# Patient Record
Sex: Female | Born: 2000 | Race: Black or African American | Hispanic: No | Marital: Single | Smoking: Never smoker
Health system: Southern US, Community
[De-identification: ages and names within clinical notes are randomized; demographics above are authoritative.]

---
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
## 2021-02-27 ENCOUNTER — Telehealth: Payer: Self-pay

## 2021-02-27 NOTE — Telephone Encounter (Signed)
Tish from [place] [facility] called would like to see about setting up time to talk to Dr. Milinda Antis about patient. States that she has ROI from patient. Please call to set something up.

## 2021-02-27 NOTE — Telephone Encounter (Signed)
Dr. Milinda Antis please advise, let me know what time and date works for you so I can f/u with [facility]

## 2021-02-27 NOTE — Telephone Encounter (Signed)
How about Thursday when I am done seeing patients around 12:30-1?

## 2021-02-27 NOTE — Telephone Encounter (Signed)
Left VM letting Tish know what time/day works for Dr. Milinda Antis and she can call back and confirm or just have provider call back at that time

## 2021-03-09 ENCOUNTER — Telehealth: Payer: Self-pay | Admitting: Family Medicine

## 2021-03-09 NOTE — Telephone Encounter (Signed)
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

## 2021-03-09 NOTE — Telephone Encounter (Signed)
Pt notified of Dr. Royden Purl comments/instructions and agreed to scheduled f/u. Appt scheduled for 03/14/21  FYI to PCP

## 2021-03-14 ENCOUNTER — Other Ambulatory Visit: Payer: Self-pay

## 2021-03-14 ENCOUNTER — Ambulatory Visit: Payer: Managed Care, Other (non HMO) | Admitting: Family Medicine

## 2021-03-14 ENCOUNTER — Encounter: Payer: Self-pay | Admitting: Family Medicine

## 2021-03-14 VITALS — BP 98/58 | HR 70 | Temp 96.9°F | Ht 65.0 in | Wt 133.1 lb

## 2021-03-14 DIAGNOSIS — E611 Iron deficiency: Secondary | ICD-10-CM | POA: Diagnosis not present

## 2021-03-14 DIAGNOSIS — F4323 Adjustment disorder with mixed anxiety and depressed mood: Secondary | ICD-10-CM | POA: Diagnosis not present

## 2021-03-14 DIAGNOSIS — F509 Eating disorder, unspecified: Secondary | ICD-10-CM

## 2021-03-14 NOTE — Patient Instructions (Addendum)
Try and get some protein three times daily   Try to eat a small meal even if not hungry  Non dairy protein shake or protein bar  Look for ensure protein free /vegan shakes   Other brands include Evolve Orgain Aloha   Any protein powder could also be mixed with oat milk   I think you should have one session with nutritionist  Please call the numbers that Krystal Brock gave you  Discuss with her at the next visit   Start a multi vitamin with iron daily  You are a little iron deficient  Look for a gummy or a small pill  Any multi vitamin with iron over the counter   Start that daily and stop up front to plan labs in 4-6 weeks for iron   We can consider medication for anxiety and depression (may help eating problems also)  Let me know if you are interested Talk to your counselor first

## 2021-03-14 NOTE — Assessment & Plan Note (Signed)
Seeing counselor Chaya Jan at [facility]  Enc her to continue  Long discussion re: affect on eating and self care Stress is lower out of school  Urged to continue counseling  Improve diet and self care (per pt that is improved) Offered info/handout on fluoxetine to consider and pt will call if interested

## 2021-03-14 NOTE — Progress Notes (Signed)
Subjective:    Patient ID: Krystal Brock, female    DOB: 2001-09-02, 20 y.o.   MRN: [id number]  This visit occurred during the SARS-CoV-2 public health emergency.  Safety protocols were in place, including screening questions prior to the visit, additional usage of staff PPE, and extensive cleaning of exam room while observing appropriate contact time as indicated for disinfecting solutions.   HPI Wt Readings from Last 3 Encounters:  03/14/21 133 lb 2 oz (60.4 kg) (61 %, Z= 0.28)*  12/16/20 127 lb (57.6 kg) (51 %, Z= 0.03)*  05/20/20 127 lb 7 oz (57.8 kg) (55 %, Z= 0.12)*   * Growth percentiles are based on CDC (Girls, 2-20 Years) data.   22.15 kg/m (57 %, Z= 0.16, Source: CDC (Girls, 2-20 Years))  Pt presents for f/u to discuss nutrition  I spoke to her [facility] at [facility]  I rev last PE and labs with her   She is in counseling for adj disorder with  anx/depressed mood  Not on medication  Has a lot of food fixation issues   Per pt-on and off for entire life  Would not eat  Then more hungry when period comes  Used to really care about her weight  Does not care as much now    Parents would buy her snack  Enc her to eat protein    Eating better right now because she is closer to food  In college she did not want to walk to get food   Likes anything with cinnamon in it  Mood for the most part is improved from college Still has her moments  Worse when she is on her period   Pt thinks that her mood does not affect eating that much (more a preference issue)  If stressed out- won't eat   Today she has not eaten  Not emotional either   She cannot make herself eat when she is not hungry  This happened a lot in HS    She was given numbers of 2 nutritionists to connect to  Not planning to go because she thinks she is doing better    Lab Results  Component Value Date   WBC 4.1 (L) 12/06/2020   HGB 10.8 (L) 12/06/2020   HCT 32.1  (L) 12/06/2020   MCV 91.9 12/06/2020   PLT 229.0 12/06/2020   No results found for: IRON, TIBC, FERRITIN  Some fatigue -? If iron related  No restless legs    Prefers to stay in the 120s   Protein:  Chicken  Occ hamburger or ham  No nuts  No peanut butter  Likes almond and oat milk  Lactose intolerant - no dairy  No beans  No other meat  No fish   Is interested in protein shake if non dairy   Fruit -likes it  Grapes Plums Blueberries Apples  Watermelon   Vegetables  None whatsoever  Taste and texure both - "disgusting"   Junk food  Cinnomin roles and crumb cakes  [place] toast sticks  Fixated with cinnomin (really likes it)  Rarely eats chips   Fast food A lot of pizza (even with lactose intol)  Chick filet  Occ burger   Occ ham   Patient Active Problem List   Diagnosis Date Noted   Iron deficiency 03/14/2021   Disordered eating 12/16/2020   Routine general medical examination at a health care facility 12/05/2020   Pilonidal cyst 05/20/2020   Intolerance, food  08/03/2019   GERD (gastroesophageal reflux disease) 08/03/2019   Other allergic rhinitis 08/03/2019   No past medical history on file. No past surgical history on file. Social History   Tobacco Use   Smoking status: Never   Smokeless tobacco: Never  Vaping Use   Vaping Use: Never used  Substance Use Topics   Alcohol use: Never   Drug use: Never   Family History  Problem Relation Age of Onset   Allergic rhinitis Father    Asthma Father    Thyroid disease Paternal Grandmother    Migraines Neg Hx    Celiac disease Neg Hx    Crohn's disease Neg Hx    Colitis Neg Hx    Diabetes Neg Hx    GER disease Neg Hx    Allergies  Allergen Reactions   Amoxicillin    Clindamycin/Lincomycin Rash    Delayed rash on trunk   No current outpatient medications on file prior to visit.   No current facility-administered medications on file prior to visit.    Review of Systems  Constitutional:   Positive for fatigue. Negative for activity change, appetite change, fever and unexpected weight change.  HENT:  Negative for congestion, ear pain, rhinorrhea, sinus pressure and sore throat.   Eyes:  Negative for pain, redness and visual disturbance.  Respiratory:  Negative for cough, shortness of breath and wheezing.   Cardiovascular:  Negative for chest pain and palpitations.  Gastrointestinal:  Negative for abdominal pain, blood in stool, constipation and diarrhea.  Endocrine: Negative for polydipsia and polyuria.  Genitourinary:  Negative for dysuria, frequency and urgency.  Musculoskeletal:  Negative for arthralgias, back pain and myalgias.  Skin:  Negative for pallor and rash.  Allergic/Immunologic: Negative for environmental allergies.  Neurological:  Negative for dizziness, syncope and headaches.  Hematological:  Negative for adenopathy. Does not bruise/bleed easily.  Psychiatric/Behavioral:  Positive for dysphoric mood. Negative for decreased concentration, self-injury and suicidal ideas. The patient is nervous/anxious.       Objective:   Physical Exam Constitutional:      General: She is not in acute distress.    Appearance: Normal appearance. She is normal weight. She is not ill-appearing.  Eyes:     Conjunctiva/sclera: Conjunctivae normal.     Pupils: Pupils are equal, round, and reactive to light.  Cardiovascular:     Rate and Rhythm: Normal rate and regular rhythm.     Heart sounds: Normal heart sounds.  Pulmonary:     Effort: Pulmonary effort is normal. No respiratory distress.     Breath sounds: No wheezing.  Musculoskeletal:     Cervical back: Normal range of motion and neck supple. No tenderness.     Right lower leg: No edema.     Left lower leg: No edema.  Lymphadenopathy:     Cervical: No cervical adenopathy.  Skin:    General: Skin is warm and dry.     Coloration: Skin is not jaundiced or pale.     Findings: No rash.  Neurological:     Mental Status:  She is alert.     Deep Tendon Reflexes: Reflexes normal.     Comments: No tremor  Psychiatric:        Attention and Perception: Attention normal.        Mood and Affect: Affect is blunt.        Speech: Speech normal.        Behavior: Behavior normal.        Cognition  and Memory: Cognition and memory normal.     Comments: Tries to answer questions (quiet) At times does not know how she feels about things and does not have an answer  Re: why she eats like she does           Assessment & Plan:   Problem List Items Addressed This Visit       Other   Disordered eating - Primary    Lifelong food fixation/preference issues and some (mild per pt) body image issues along with anx and depression  Urged her to continue counseling  Disc nutritional consult-she may be open to this now  Spent over 30 minutes today discussing diet and food choices/preferences and emotions involved Will continue counseling Pursue nutritional consult (she has references)  Work on protein( gave names of dairy free protein supplements)  Offered px for fluoxetine-given info on the in regards to anx, dep, disordered eating and pmdd  She will d/w counselor and let us know if she wants to start         Iron deficiency    Due to picky diet and menses Recommend mvi with iron (she will get otc)        Adjustment reaction with anxiety and depression    Seeing counselor Chaya Jan at [facility]  Enc her to continue  Long discussion re: affect on eating and self care Stress is lower out of school  Urged to continue counseling  Improve diet and self care (per pt that is improved) Offered info/handout on fluoxetine to consider and pt will call if interested

## 2021-03-14 NOTE — Assessment & Plan Note (Signed)
Due to picky diet and menses Recommend mvi with iron (she will get otc)

## 2021-03-14 NOTE — Assessment & Plan Note (Signed)
Lifelong food fixation/preference issues and some (mild per pt) body image issues along with anx and depression  Urged her to continue counseling  Disc nutritional consult-she may be open to this now  Spent over 30 minutes today discussing diet and food choices/preferences and emotions involved Will continue counseling Pursue nutritional consult (she has references)  Work on protein( gave names of dairy free protein supplements)  Offered px for fluoxetine-given info on the in regards to anx, dep, disordered eating and pmdd  She will d/w counselor and let us know if she wants to start

## 2022-01-05 ENCOUNTER — Telehealth: Payer: Self-pay

## 2022-01-05 NOTE — Telephone Encounter (Signed)
I spoke with pt; pt did not go to UC or ED; pt said last nosebleed was 01/03/22 and wants to be checked and see if anything needed to prevent nose bleeds. No problems today and pt scheduled appt with Dr Milinda Antis on 01/08/22 and UC & ED precautions given and pt voiced understanding. Sending note to Dr Milinda Antis. ?
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
## 2022-01-05 NOTE — Telephone Encounter (Signed)
Aware, will see her then 

## 2022-01-08 ENCOUNTER — Encounter: Payer: Self-pay | Admitting: Family Medicine

## 2022-01-08 ENCOUNTER — Ambulatory Visit (INDEPENDENT_AMBULATORY_CARE_PROVIDER_SITE_OTHER): Payer: 59 | Admitting: Family Medicine

## 2022-01-08 DIAGNOSIS — R040 Epistaxis: Secondary | ICD-10-CM | POA: Diagnosis not present

## 2022-01-08 NOTE — Progress Notes (Signed)
? ?Subjective:  ? ? Patient ID: Krystal Brock, female    DOB: Feb 12, 2001, 21 y.o.   MRN: [id number] ? ?HPI ?Pt presents with c/o nose bleeds  ? ?Wt Readings from Last 3 Encounters:  ?01/08/22 135 lb 4 oz (61.3 kg)  ?03/14/21 133 lb 2 oz (60.4 kg) (61 %, Z= 0.28)*  ?12/16/20 127 lb (57.6 kg) (51 %, Z= 0.03)*  ? ?* Growth percentiles are based on CDC (Girls, 2-20 Years) data.  ? ?22.51 kg/m? ? ?Nosebleeds for a long time : has had 10 in the past month  ? ?Nose bleeds tend to last 3-5 minutes  ?On the R side  ?Once a little on L side  ? ?She puts tissue in nose and waits for it to stop  ?Some clots , a lot of blood  ?Also some pain , if she bends forward  (just in r nostril)  ? ?No headaches  ?Some nasal allergies - takes pill (over the counter antihistamine she thinks)  ?Does not use nasal sprays  ? ?Some blood in stool / dark in color  ?No abd pain  ?Once she got nauseated right after - never happened again  ?Occ loose stool  ?No constipation  ?No straining  ?No rectal pain  ?No bulge  ? ?Not on asa or nsaids  ? ? ? ?Patient Active Problem List  ? Diagnosis Date Noted  ? Epistaxis 01/08/2022  ? Iron deficiency 03/14/2021  ? Adjustment reaction with anxiety and depression 03/14/2021  ? Disordered eating 12/16/2020  ? Routine general medical examination at a health care facility 12/05/2020  ? Pilonidal cyst 05/20/2020  ? Intolerance, food 08/03/2019  ? GERD (gastroesophageal reflux disease) 08/03/2019  ? Other allergic rhinitis 08/03/2019  ? ?History reviewed. No pertinent past medical history. ?History reviewed. No pertinent surgical history. ?Social History  ? ?Tobacco Use  ? Smoking status: Never  ? Smokeless tobacco: Never  ?Vaping Use  ? Vaping Use: Never used  ?Substance Use Topics  ? Alcohol use: Never  ? Drug use: Never  ? ?Family History  ?Problem Relation Age of Onset  ? Allergic rhinitis Father   ? Asthma Father   ? Thyroid disease Paternal Grandmother   ? Migraines Neg Hx   ? Celiac disease Neg Hx   ? Crohn's  disease Neg Hx   ? Colitis Neg Hx   ? Diabetes Neg Hx   ? GER disease Neg Hx   ? ?Allergies  ?Allergen Reactions  ? Amoxicillin   ? Clindamycin/Lincomycin Rash  ?  Delayed rash on trunk  ? ?Current Outpatient Medications on File Prior to Visit  ?Medication Sig Dispense Refill  ? APAP-Pamabrom-Pyrilamine (PAMPRIN MULTI-SYMPTOM MAX ST PO) Take by mouth.    ? ?No current facility-administered medications on file prior to visit.  ?  ? ?Review of Systems  ?Constitutional:  Negative for activity change, appetite change, fatigue, fever and unexpected weight change.  ?HENT:  Positive for nosebleeds. Negative for congestion, ear pain, rhinorrhea, sinus pressure and sore throat.   ?Eyes:  Negative for pain, redness and visual disturbance.  ?Respiratory:  Negative for cough, shortness of breath and wheezing.   ?Cardiovascular:  Negative for chest pain and palpitations.  ?Gastrointestinal:  Negative for abdominal pain, blood in stool, constipation, diarrhea and rectal pain.  ?Endocrine: Negative for polydipsia and polyuria.  ?Genitourinary:  Negative for dysuria, frequency and urgency.  ?Musculoskeletal:  Negative for arthralgias, back pain and myalgias.  ?Skin:  Negative for pallor and rash.  ?  Allergic/Immunologic: Negative for environmental allergies.  ?Neurological:  Negative for dizziness, syncope and headaches.  ?Hematological:  Negative for adenopathy. Does not bruise/bleed easily.  ?Psychiatric/Behavioral:  Negative for decreased concentration and dysphoric mood. The patient is not nervous/anxious.   ? ?   ?Objective:  ? Physical Exam ?Constitutional:   ?   General: She is not in acute distress. ?   Appearance: Normal appearance. She is normal weight. She is not ill-appearing.  ?HENT:  ?   Head: Normocephalic and atraumatic.  ?   Right Ear: Tympanic membrane, ear canal and external ear normal.  ?   Left Ear: Tympanic membrane, ear canal and external ear normal.  ?   Nose: Mucosal edema present. No septal deviation, signs  of injury or nasal tenderness.  ?   Right Nostril: Septal hematoma present. No epistaxis.  ?   Left Nostril: No epistaxis.  ?   Right Turbinates: Not enlarged.  ?   Left Turbinates: Not enlarged.  ?   Right Sinus: No maxillary sinus tenderness or frontal sinus tenderness.  ?   Left Sinus: No maxillary sinus tenderness or frontal sinus tenderness.  ?   Comments: Very small scab on r septum  ?   Mouth/Throat:  ?   Mouth: Mucous membranes are moist.  ?   Pharynx: Oropharynx is clear. No posterior oropharyngeal erythema.  ?Eyes:  ?   General: No scleral icterus. ?   Conjunctiva/sclera: Conjunctivae normal.  ?   Pupils: Pupils are equal, round, and reactive to light.  ?Cardiovascular:  ?   Rate and Rhythm: Normal rate and regular rhythm.  ?   Heart sounds: Normal heart sounds.  ?Pulmonary:  ?   Effort: Pulmonary effort is normal. No respiratory distress.  ?   Breath sounds: Normal breath sounds. No wheezing.  ?Abdominal:  ?   General: Abdomen is flat. Bowel sounds are normal. There is no distension.  ?   Palpations: There is no mass.  ?   Tenderness: There is no abdominal tenderness.  ?Musculoskeletal:  ?   Cervical back: Neck supple.  ?Lymphadenopathy:  ?   Cervical: No cervical adenopathy.  ?Skin: ?   General: Skin is warm and dry.  ?   Coloration: Skin is not jaundiced or pale.  ?   Findings: No bruising.  ?Neurological:  ?   Mental Status: She is alert.  ?   Cranial Nerves: No cranial nerve deficit.  ?Psychiatric:     ?   Mood and Affect: Mood normal.  ? ? ? ? ? ?   ?Assessment & Plan:  ? ?Problem List Items Addressed This Visit   ? ?  ? Other  ? Epistaxis  ?  Right sided and recurrent/frequent but able to stop w/in 5 minutes  ?Some dark/? Blood in stool-this correlates and may be from swallowed blood  ?Small scab on R septum on exam ?Disc use of abx oint in nare to prevent cracking  ?Avoid dry air, avoid nasal sprays  ?Disc way to apply pressure prn /handout given  ?Ref to ENT for further evaluation and treatment  /cauterization if needed  ?ER precautions discussed  ?inst to f/u if she continues to have stool changes once nosebleeds stop ? ? ?  ?  ? Relevant Orders  ? Ambulatory referral to ENT  ? ? ?

## 2022-01-08 NOTE — Assessment & Plan Note (Addendum)
Right sided and recurrent/frequent but able to stop w/in 5 minutes  ?Some dark/? Blood in stool-this correlates and may be from swallowed blood  ?Small scab on R septum on exam ?Disc use of abx oint in nare to prevent cracking  ?Avoid dry air, avoid nasal sprays  ?Disc way to apply pressure prn /handout given  ?Ref to ENT for further evaluation and treatment /cauterization if needed  ?ER precautions discussed  ?inst to f/u if she continues to have stool changes once nosebleeds stop ? ?

## 2022-01-08 NOTE — Patient Instructions (Addendum)
Use a tiny bit of antibiotic ointment in your right nostrils at bedtime  ?This is to help heal any scabs that may be there  ? ?If you blow your nose, do so gently  ? ?For a nose bleed hold pressure (pinch) for at least 5 minutes without letting go  ?If you have one that lasts more than 30 minutes, go to the ER ? ?Bleeding from the nose /swallowing blood can cause dark colored blood in your stool  ?Once the nose bleeds are better, watch to make sure the blood in stool does not continue  ?If it continues please let us know  ? ?I will place a referral to ENT  ?You will get a call  ? ?If you do not hear in a week please let us know  ? ? ?

## 2022-01-17 ENCOUNTER — Encounter: Payer: Self-pay | Admitting: *Deleted

## 2022-05-06 ENCOUNTER — Emergency Department
Admission: EM | Admit: 2022-05-06 | Discharge: 2022-05-06 | Disposition: A | Discharge status: Home / Self Care | Payer: 59 | Attending: Emergency Medicine | Admitting: Emergency Medicine

## 2022-05-06 ENCOUNTER — Emergency Department: Payer: 59

## 2022-05-06 ENCOUNTER — Other Ambulatory Visit: Payer: Self-pay

## 2022-05-06 DIAGNOSIS — R519 Headache, unspecified: Secondary | ICD-10-CM | POA: Diagnosis present

## 2022-05-06 DIAGNOSIS — R29898 Other symptoms and signs involving the musculoskeletal system: Secondary | ICD-10-CM | POA: Diagnosis not present

## 2022-05-06 DIAGNOSIS — G43109 Migraine with aura, not intractable, without status migrainosus: Secondary | ICD-10-CM | POA: Insufficient documentation

## 2022-05-06 DIAGNOSIS — R202 Paresthesia of skin: Secondary | ICD-10-CM | POA: Diagnosis not present

## 2022-05-06 DIAGNOSIS — Z20822 Contact with and (suspected) exposure to covid-19: Secondary | ICD-10-CM | POA: Diagnosis not present

## 2022-05-06 DIAGNOSIS — R200 Anesthesia of skin: Secondary | ICD-10-CM

## 2022-05-06 LAB — URINE DRUG SCREEN, QUALITATIVE (ARMC ONLY)
Amphetamines, Ur Screen: NOT DETECTED
Barbiturates, Ur Screen: NOT DETECTED
Benzodiazepine, Ur Scrn: NOT DETECTED
Cannabinoid 50 Ng, Ur ~~LOC~~: NOT DETECTED
Cocaine Metabolite,Ur ~~LOC~~: NOT DETECTED
MDMA (Ecstasy)Ur Screen: NOT DETECTED
Methadone Scn, Ur: NOT DETECTED
Opiate, Ur Screen: NOT DETECTED
Phencyclidine (PCP) Ur S: NOT DETECTED
Tricyclic, Ur Screen: NOT DETECTED

## 2022-05-06 LAB — CBC WITH DIFFERENTIAL/PLATELET
Abs Immature Granulocytes: 0.02 10*3/uL (ref 0.00–0.07)
Basophils Absolute: 0 10*3/uL (ref 0.0–0.1)
Basophils Relative: 0 %
Eosinophils Absolute: 0.3 10*3/uL (ref 0.0–0.5)
Eosinophils Relative: 3 %
HCT: 37.2 % (ref 36.0–46.0)
Hemoglobin: 11.5 g/dL — ABNORMAL LOW (ref 12.0–15.0)
Immature Granulocytes: 0 %
Lymphocytes Relative: 35 %
Lymphs Abs: 3.1 10*3/uL (ref 0.7–4.0)
MCH: 29.3 pg (ref 26.0–34.0)
MCHC: 30.9 g/dL (ref 30.0–36.0)
MCV: 94.7 fL (ref 80.0–100.0)
Monocytes Absolute: 1 10*3/uL (ref 0.1–1.0)
Monocytes Relative: 12 %
Neutro Abs: 4.4 10*3/uL (ref 1.7–7.7)
Neutrophils Relative %: 50 %
Platelets: 214 10*3/uL (ref 150–400)
RBC: 3.93 MIL/uL (ref 3.87–5.11)
RDW: 12.6 % (ref 11.5–15.5)
WBC: 8.9 10*3/uL (ref 4.0–10.5)
nRBC: 0 % (ref 0.0–0.2)

## 2022-05-06 LAB — URINALYSIS, ROUTINE W REFLEX MICROSCOPIC
Bilirubin Urine: NEGATIVE
Glucose, UA: NEGATIVE mg/dL
Hgb urine dipstick: NEGATIVE
Ketones, ur: NEGATIVE mg/dL
Leukocytes,Ua: NEGATIVE
Nitrite: NEGATIVE
Protein, ur: NEGATIVE mg/dL
Specific Gravity, Urine: >1.046 — ABNORMAL HIGH (ref 1.005–1.030)
pH: 7 (ref 5.0–8.0)

## 2022-05-06 LAB — COMPREHENSIVE METABOLIC PANEL
ALT: 15 U/L (ref 0–44)
AST: 23 U/L (ref 15–41)
Albumin: 4.7 g/dL (ref 3.5–5.0)
Alkaline Phosphatase: 69 U/L (ref 38–126)
Anion gap: 10 (ref 5–15)
BUN: 9 mg/dL (ref 6–20)
CO2: 21 mmol/L — ABNORMAL LOW (ref 22–32)
Calcium: 9.4 mg/dL (ref 8.9–10.3)
Chloride: 106 mmol/L (ref 98–111)
Creatinine, Ser: 0.68 mg/dL (ref 0.44–1.00)
GFR, Estimated: >60 mL/min (ref >60)
Glucose, Bld: 107 mg/dL — ABNORMAL HIGH (ref 70–99)
Potassium: 4 mmol/L (ref 3.5–5.1)
Sodium: 137 mmol/L (ref 135–145)
Total Bilirubin: 0.7 mg/dL (ref 0.3–1.2)
Total Protein: 8.7 g/dL — ABNORMAL HIGH (ref 6.5–8.1)

## 2022-05-06 LAB — CBG MONITORING, ED
Glucose-Capillary: 49 mg/dL — ABNORMAL LOW (ref 70–99)
Glucose-Capillary: 103 mg/dL — ABNORMAL HIGH (ref 70–99)

## 2022-05-06 LAB — ETHANOL: Alcohol, Ethyl (B): <10 mg/dL (ref <10)

## 2022-05-06 LAB — RESP PANEL BY RT-PCR (FLU A&B, COVID) ARPGX2
Influenza A by PCR: NEGATIVE
Influenza B by PCR: NEGATIVE
SARS Coronavirus 2 by RT PCR: NEGATIVE

## 2022-05-06 LAB — PROTIME-INR
INR: 1.1 (ref 0.8–1.2)
Prothrombin Time: 13.9 seconds (ref 11.4–15.2)

## 2022-05-06 LAB — APTT: aPTT: 35 seconds (ref 24–36)

## 2022-05-06 MED ORDER — BUTALBITAL-APAP-CAFFEINE 50-325-40 MG PO TABS: 1.0000 | ORAL_TABLET | Freq: Four times a day (QID) | ORAL | 0 refills | Status: AC | PRN

## 2022-05-06 MED ORDER — KETOROLAC TROMETHAMINE 30 MG/ML IJ SOLN
30.0000 mg | Freq: Once | INTRAMUSCULAR | Status: AC
Start: 2022-05-06 — End: 2022-05-06
  Administered 2022-05-06: 30 mg via INTRAVENOUS
  Filled 2022-05-06: qty 1

## 2022-05-06 MED ORDER — DIPHENHYDRAMINE HCL 50 MG/ML IJ SOLN
25.0000 mg | Freq: Once | INTRAMUSCULAR | Status: AC
  Administered 2022-05-06: 25 mg via INTRAVENOUS
  Filled 2022-05-06: qty 1

## 2022-05-06 MED ORDER — METOCLOPRAMIDE HCL 5 MG/ML IJ SOLN
10.0000 mg | Freq: Once | INTRAMUSCULAR | Status: AC
  Administered 2022-05-06: 10 mg via INTRAVENOUS
  Filled 2022-05-06: qty 2

## 2022-05-06 MED ORDER — DEXAMETHASONE SODIUM PHOSPHATE 10 MG/ML IJ SOLN
10.0000 mg | Freq: Once | INTRAMUSCULAR | Status: AC
Start: 2022-05-06 — End: 2022-05-06
  Administered 2022-05-06: 10 mg via INTRAVENOUS
  Filled 2022-05-06: qty 1

## 2022-05-06 MED ORDER — SODIUM CHLORIDE 0.9 % IV BOLUS
1000.0000 mL | Freq: Once | INTRAVENOUS | Status: AC
Start: 2022-05-06 — End: 2022-05-06
  Administered 2022-05-06: 1000 mL via INTRAVENOUS

## 2022-05-06 MED ORDER — GADOBUTROL 1 MMOL/ML IV SOLN
6.0000 mL | Freq: Once | INTRAVENOUS | Status: AC | PRN
Start: 2022-05-06 — End: 2022-05-06
  Administered 2022-05-06: 6 mL via INTRAVENOUS

## 2022-05-06 MED ORDER — IOHEXOL 350 MG/ML SOLN
75.0000 mL | Freq: Once | INTRAVENOUS | Status: AC | PRN
  Administered 2022-05-06: 75 mL via INTRAVENOUS

## 2022-05-06 NOTE — Consult Note (Signed)
[facility] Consult Services   Patient Name:   Krystal Brock, Krystal Brock Date of Birth:   11-01-2000 Identification Number:   MRN - [id number] Date of Service:   05/06/2022 20:23:56  Diagnosis:       R20.2 - Paresthesia of skin       R51.9 - Headache, unspecified  Impression:      21 y/o woman with severe headaches ongoing for two weeks and new onset of paresthesias to left arm and bilateral legs today. Overall low suspicion for stroke with this history, more likely status migrainosus, but since she does not have a clear history of migraines and certainly none with neurologic symptoms before, I would recommend brain MRI (with and without contrast) to rule out any structural cause of her symptoms including strokes and demyelination. Can try a migraine cocktail such as Toradol/Compazine/Benadryl also. If the MRI returns normal and her symptoms improve, from my standpoint would be okay to d/c home from the ED.  Sign Out:       Discussed with Emergency Department Provider    ------------------------------------------------------------------------------  Advanced Imaging: CTA Head and Neck Completed.  LVO:No  Patient in not a candidate for NIR   Metrics: Last Known Well: 05/06/2022 19:10:00 [facility] Notification Time: 05/06/2022 20:23:56 Arrival Time: 05/06/2022 19:37:00 Stamp Time: 05/06/2022 20:23:56 Initial Response Time: 05/06/2022 20:26:58 Symptoms: LUE and BLE numbness. Initial patient interaction: 05/06/2022 20:28:39 NIHSS Assessment Completed: 05/06/2022 20:34:49 Patient is not a candidate for Thrombolytic. Thrombolytic Medical Decision: 05/06/2022 20:34:50 Patient was not deemed candidate for Thrombolytic because of following reasons: No disabling symptoms. Other Diagnosis suspected.  CT head showed no acute hemorrhage or acute core infarct.  Primary Provider Notified of Diagnostic Impression and Management Plan on: 05/06/2022  20:43:28    ------------------------------------------------------------------------------  History of Present Illness: Patient is a 21 year old Female.  Patient was brought by private transportation with symptoms of LUE and BLE numbness. Patient states she's had a bad headache ongoing for about two weeks now. Has had some headaches in the past but not severe like this. About an hour and a half ago she started feeling numbness in her left arm and both legs. Also felt like her left arm was weak, but did not notice any weakness in her legs and was able to walk without difficulty.    Past Medical History: none significant  Medications:  No Anticoagulant use  No Antiplatelet use Reviewed EMR for current medications  Allergies:  Reviewed  Social History: Smoking: No  Family History:  There is no family history of premature cerebrovascular disease pertinent to this consultation  ROS : 14 Points Review of Systems was performed and was negative except mentioned in HPI.  Past Surgical History: There Is No Surgical History Contributory To Today's Visit     Examination: BP(102/37), Pulse(71), 1A: Level of Consciousness - Alert; keenly responsive + 0 1B: Ask Month and Age - Both Questions Right + 0 1C: Blink Eyes & Squeeze Hands - Performs Both Tasks + 0 2: Test Horizontal Extraocular Movements - Normal + 0 3: Test Visual Fields - No Visual Loss + 0 4: Test Facial Palsy (Use Grimace if Obtunded) - Normal symmetry + 0 5A: Test Left Arm Motor Drift - No Drift for 10 Seconds + 0 5B: Test Right Arm Motor Drift - No Drift for 10 Seconds + 0 6A: Test Left Leg Motor Drift - No Drift for 5 Seconds + 0 6B: Test Right Leg Motor Drift - No Drift for 5 Seconds +  0 7: Test Limb Ataxia (FNF/Heel-Shin) - No Ataxia + 0 8: Test Sensation - Mild-Moderate Loss: Less Sharp/More Dull + 1 9: Test Language/Aphasia - Normal; No aphasia + 0 10: Test Dysarthria - Normal + 0 11: Test  Extinction/Inattention - No abnormality + 0  NIHSS Score: 1   Pre-Morbid Modified Rankin Scale: 0 Points = No symptoms at all  Spoke with : Dr. Vicente Males  Patient/Family was informed the Neurology Consult would occur via TeleHealth consult by way of interactive audio and video telecommunications and consented to receiving care in this manner.   Patient is being evaluated for possible acute neurologic impairment and high probability of imminent or life-threatening deterioration. I spent total of 22 minutes providing care to this patient, including time for face to face visit via telemedicine, review of medical records, imaging studies and discussion of findings with providers, the patient and/or family.   Dr Sherilyn Cooter   [facility] For Inpatient follow-up with [facility] physician please call [phone]. This is not an outpatient service. Post hospital discharge, please contact hospital directly.

## 2022-05-06 NOTE — ED Triage Notes (Signed)
Ambulatory to triage with c/o " feeling like my circulation is cut off to my left arm." Sx began apx 1 hour ago. Also has sharp pain under armpit Reports she also feels sx are spreading to both legs, but resolves when she walks. Sensation diminished on left arm. Has also had headaches x 2 weeks.  Mother reports pt has recently began seeing a chiropractor and had 2 adjustments last week.  Pt is noted to have significantly reduced strength in left hand. No other neuro deficits noted

## 2022-05-06 NOTE — ED Provider Notes (Signed)
[facility] Provider Note   Event Date/Time   First MD Initiated Contact with Patient 05/06/22 2011     (approximate) History  Extremity Weakness and Code Stroke  HPI Krystal Brock is a 21 y.o. female with no stated past medical history presents for a headache for the past 2 days with accompanying left upper extremity numbness and intermittent bilateral lower extremity weakness.  Patient states that her neurologic symptoms improve when she is up and walking around.  Patient also endorses mild photophobia, phonophobia and nausea over the last 2 days that have accompanied her headache.  Patient states that these neurologic symptoms began an hour prior to arrival ROS: Patient currently denies any vision changes, tinnitus, difficulty speaking, facial droop, sore throat, chest pain, shortness of breath, abdominal pain, nausea/vomiting/diarrhea, dysuria, or paresthesias in any extremity   Physical Exam  Triage Vital Signs: ED Triage Vitals  Enc Vitals Group     BP 05/06/22 1955 (!) 102/37     Pulse Rate 05/06/22 1955 71     Resp 05/06/22 1955 18     Temp 05/06/22 1955 98.6 F (37 C)     Temp Source 05/06/22 1955 Oral     SpO2 05/06/22 1955 99 %     Weight 05/06/22 1956 130 lb (59 kg)     Height 05/06/22 1956 5\' 6"  (1.676 m)     Head Circumference --      Peak Flow --      Pain Score 05/06/22 1956 7     Pain Loc --      Pain Edu? --      Excl. in GC? --    Most recent vital signs: Vitals:   05/06/22 2100 05/06/22 2230  BP: 106/70 104/64  Pulse: 70 77  Resp: (!) 23   Temp:    SpO2: 100% 100%   General: Awake, oriented x4. CV:  Good peripheral perfusion.  Resp:  Normal effort.  Abd:  No distention.  Other:  Young adult African-American female laying in bed in no acute distress.  Decreased grip strength on the left.  Decree sensation overlying the left upper extremity.  Ambulatory without difficulty but endorses decreased strength in bilateral lower  extremities ED Results / Procedures / Treatments  Labs (all labs ordered are listed, but only abnormal results are displayed) Labs Reviewed  CBC WITH DIFFERENTIAL/PLATELET - Abnormal; Notable for the following components:      Result Value   Hemoglobin 11.5 (*)    All other components within normal limits  COMPREHENSIVE METABOLIC PANEL - Abnormal; Notable for the following components:   CO2 21 (*)    Glucose, Bld 107 (*)    Total Protein 8.7 (*)    All other components within normal limits  URINALYSIS, ROUTINE W REFLEX MICROSCOPIC - Abnormal; Notable for the following components:   Color, Urine STRAW (*)    APPearance CLEAR (*)    Specific Gravity, Urine >1.046 (*)    All other components within normal limits  CBG MONITORING, ED - Abnormal; Notable for the following components:   Glucose-Capillary 49 (*)    All other components within normal limits  CBG MONITORING, ED - Abnormal; Notable for the following components:   Glucose-Capillary 103 (*)    All other components within normal limits  RESP PANEL BY RT-PCR (FLU A&B, COVID) ARPGX2  PROTIME-INR  APTT  URINE DRUG SCREEN, QUALITATIVE ([facility] ONLY)  ETHANOL  POC URINE PREG, ED   EKG ED ECG REPORT  I, Merwyn Katos, the attending physician, personally viewed and interpreted this ECG. Date: 05/06/2022 EKG Time: 2036 Rate: 87 Rhythm: normal sinus rhythm QRS Axis: normal Intervals: normal ST/T Wave abnormalities: normal Narrative Interpretation: no evidence of acute ischemia RADIOLOGY ED MD interpretation: CT of the head without contrast interpreted by me shows no evidence of acute abnormalities including no intracerebral hemorrhage, obvious masses, or significant edema  CT angiography of the head and neck with contrast interpreted by me and shows no intracranial large vessel occlusion or significant stenosis and without any evidence of aneurysm.  There is also no hemodynamically significant stenosis in the neck or evidence of  dissection.  MRI of the brain with and without contrast interpreted by me and shows no acute intracranial process including no evidence of acute or subacute infarct -Agree with radiology assessment Official radiology report(s): MR Brain W and Wo Contrast  Result Date: 05/06/2022 CLINICAL DATA:  Left-sided weakness, stroke suspected EXAM: MRI HEAD WITHOUT AND WITH CONTRAST TECHNIQUE: Multiplanar, multiecho pulse sequences of the brain and surrounding structures were obtained without and with intravenous contrast. CONTRAST:  82mL GADAVIST GADOBUTROL 1 MMOL/ML IV SOLN COMPARISON:  No prior MRI, correlation is made with CT head 05/06/2022 FINDINGS: Brain: No restricted diffusion to suggest acute or subacute infarct. No acute hemorrhage, mass, mass effect, or midline shift. No hemosiderin deposition to suggest remote hemorrhage. No hydrocephalus or extra-axial collection. No abnormal T2 hyperintense signal in the periventricular or juxtacortical white matter. No abnormal parenchymal or meningeal enhancement. Vascular: Normal arterial flow voids. Normal arterial and venous enhancement. Skull and upper cervical spine: Normal marrow signal. Sinuses/Orbits: No acute finding. Other: The mastoids are well aerated. IMPRESSION: No acute intracranial process. No evidence of acute or subacute infarct. Electronically Signed   By: Wiliam Ke M.D.   On: 05/06/2022 22:27   CT ANGIO HEAD NECK W WO CM  Result Date: 05/06/2022 CLINICAL DATA:  Left-sided weakness EXAM: CT ANGIOGRAPHY HEAD AND NECK TECHNIQUE: Multidetector CT imaging of the head and neck was performed using the standard protocol during bolus administration of intravenous contrast. Multiplanar CT image reconstructions and MIPs were obtained to evaluate the vascular anatomy. Carotid stenosis measurements (when applicable) are obtained utilizing NASCET criteria, using the distal internal carotid diameter as the denominator. RADIATION DOSE REDUCTION: This exam was  performed according to the departmental dose-optimization program which includes automated exposure control, adjustment of the mA and/or kV according to patient size and/or use of iterative reconstruction technique. CONTRAST:  56mL OMNIPAQUE IOHEXOL 350 MG/ML SOLN COMPARISON:  No prior CTA, correlation is made with CT head 05/06/2022 and 05/18/2014 FINDINGS: CT HEAD FINDINGS For noncontrast findings, please see same day CT head. CTA NECK FINDINGS Aortic arch: Standard branching. Imaged portion shows no evidence of aneurysm or dissection. No significant stenosis of the major arch vessel origins. Right carotid system: Normal. No evidence of dissection, occlusion, or stenosis. Left carotid system: Normal. No evidence of dissection, occlusion, or stenosis. Vertebral arteries: Normal. No evidence of dissection, occlusion, or stenosis. Skeleton: No acute osseous abnormality. Other neck: Negative. Upper chest: Negative. Review of the MIP images confirms the above findings CTA HEAD FINDINGS Anterior circulation: Both internal carotid arteries are patent to the termini, without significant stenosis. A1 segments patent. Normal anterior communicating artery. Anterior cerebral arteries are patent to their distal aspects. No M1 stenosis or occlusion. MCA branches perfused and symmetric. Posterior circulation: Codominant vertebral system. Vertebral arteries patent to the vertebrobasilar junction without stenosis. Posterior inferior cerebellar arteries patent proximally.  Basilar patent to its distal aspect. Superior cerebellar arteries patent proximally. Evaluation of the PCAs is somewhat limited by venous contamination. Patent P1 segments. PCAs perfused to their distal aspects without stenosis. Left posterior communicating artery is patent. Venous sinuses: As permitted by contrast timing, patent. Anatomic variants: None significant. Review of the MIP images confirms the above findings IMPRESSION: 1. No intracranial large vessel  occlusion or significant stenosis. No evidence of aneurysm. 2. No hemodynamically significant stenosis in the neck. No evidence of dissection. Electronically Signed   By: Merilyn Baba M.D.   On: 05/06/2022 20:40   CT HEAD CODE STROKE WO CONTRAST  Result Date: 05/06/2022 CLINICAL DATA:  Code stroke.  Left arm numbness and weakness EXAM: CT HEAD WITHOUT CONTRAST TECHNIQUE: Contiguous axial images were obtained from the base of the skull through the vertex without intravenous contrast. RADIATION DOSE REDUCTION: This exam was performed according to the departmental dose-optimization program which includes automated exposure control, adjustment of the mA and/or kV according to patient size and/or use of iterative reconstruction technique. COMPARISON:  05/18/2014 FINDINGS: Brain: No evidence of acute infarction, hemorrhage, cerebral edema, mass, mass effect, or midline shift. No hydrocephalus or extra-axial collection. Vascular: No hyperdense vessel. Skull: Negative for fracture or focal lesion. Sinuses/Orbits: No acute finding. Other: The mastoid air cells are well aerated. ASPECTS [facility] Stroke Program Early CT Score) - Ganglionic level infarction (caudate, lentiform nuclei, internal capsule, insula, M1-M3 cortex): 7 - Supraganglionic infarction (M4-M6 cortex): 3 Total score (0-10 with 10 being normal): 10 IMPRESSION: 1. No acute intracranial process. 2. ASPECTS is 10 Code stroke imaging results were communicated on 05/06/2022 at 8:31 pm to provider [facility] via telephone, who verbally acknowledged these results. Electronically Signed   By: Merilyn Baba M.D.   On: 05/06/2022 20:33   PROCEDURES: Critical Care performed: No .1-3 Lead EKG Interpretation  Performed by: Naaman Plummer, MD Authorized by: Naaman Plummer, MD     Interpretation: normal     ECG rate:  78   ECG rate assessment: normal     Rhythm: sinus rhythm     Ectopy: none     Conduction: normal    MEDICATIONS ORDERED IN ED: Medications   iohexol (OMNIPAQUE) 350 MG/ML injection 75 mL (75 mLs Intravenous Contrast Given 05/06/22 2018)  ketorolac (TORADOL) 30 MG/ML injection 30 mg (30 mg Intravenous Given 05/06/22 2109)  metoCLOPramide (REGLAN) injection 10 mg (10 mg Intravenous Given 05/06/22 2108)  diphenhydrAMINE (BENADRYL) injection 25 mg (25 mg Intravenous Given 05/06/22 2108)  dexamethasone (DECADRON) injection 10 mg (10 mg Intravenous Given 05/06/22 2109)  sodium chloride 0.9 % bolus 1,000 mL (0 mLs Intravenous Stopped 05/06/22 2215)  gadobutrol (GADAVIST) 1 MMOL/ML injection 6 mL (6 mLs Intravenous Contrast Given 05/06/22 2143)   IMPRESSION / MDM / ASSESSMENT AND PLAN / ED COURSE  I reviewed the triage vital signs and the nursing notes.                             The patient is on the cardiac monitor to evaluate for evidence of arrhythmia and/or significant heart rate changes. Patient's presentation is most consistent with acute presentation with potential threat to life or bodily function. Stroke alert PMH risk factors: None Neurologic Deficits: Left upper extremity weakness and numbness, bilateral lower extremity weakness Last known Well Time: 1 hour prior to arrival NIH Stroke Score: 2 Given History and Exam I have lower suspicion for infectious etiology, neurologic  changes secondary to toxicologic ingestion, seizure, complex migraine. Presentation concerning for possible stroke requiring workup.  Workup: Labs: POC glucose, CBC, BMP, LFTs, Troponin, PT/INR, PTT, Type and Screen Other Diagnostics: ECG, CXR, non-contrast head CT followed by CTA brain and neck (to r/o large vessel occlusion amenable to thrombectomy) Interventions: Patient low on NIH scale and out of the window for tpa.  Consult: Neurology. Discussed with Dr. Geanie Kenning regarding patients neurological symptoms and last well-known time and eligibility for TPA criteria.  Patient does not meet tPA criteria given bilateral lower extremity symptoms as well as  possibility for migraine with aura as a cause for patient's symptoms.  Also agrees with plan for MRI with and without of the brain as well as treatment for migraine and if patient's symptoms improve, discharged with neurologic follow-up.  Reassessment: Patient's symptoms significantly improved after migraine cocktail and also states that her headache is significantly improved.  Spoke to patient at length about results as well and has prescription for Fioricet and neurologic follow-up.  Patient agrees with plan and all questions were answered prior to discharge Disposition: Discharge home with neurology follow-up   FINAL CLINICAL IMPRESSION(S) / ED DIAGNOSES   Final diagnoses:  Migraine with aura and without status migrainosus, not intractable  Numbness  Weakness of both lower extremities   Rx / DC Orders   ED Discharge Orders          Ordered    butalbital-acetaminophen-caffeine (FIORICET) 50-325-40 MG tablet  Every 6 hours PRN        05/06/22 2239           Note:  This document was prepared using Dragon voice recognition software and may include unintentional dictation errors.   Naaman Plummer, MD 05/06/22 [phone]

## 2022-05-06 NOTE — Progress Notes (Signed)
CH completed initial spiritual care screening. No further needs expressed at this time.    Rev. Elizebeth Brooking, M. Div.  Healthcare Chaplain

## 2022-05-06 NOTE — ED Notes (Signed)
EDP Bradler made aware of CBG 49, aware will recheck on patient return to room.

## 2022-05-06 NOTE — ED Provider Notes (Signed)
I was asked to evaluate this patient in triage due to acute neurologic symptoms in the past 1 hour prior to arrival.  She reports left hand weakness and left arm numbness in the past 1 hour.  She has noticeable asymmetry with weakness on left hand grip strength as well as lesser sensation to the left arm with significant pinching.  Reports a chiropractic manipulation as recently as Thursday or Friday, few days ago.  Stroke alert process initiated and I educate the physician who will be taking the patient, Dr. Vicente Males, on patient's presentation.  Please see his note for full evaluation.   Delton Prairie, MD 05/06/22 2018

## 2022-05-07 NOTE — Progress Notes (Signed)
2009 activated 2023 paged neuro 2027 neuro on screen  Per tsrn note Josiah Lobo)

## 2022-05-14 ENCOUNTER — Ambulatory Visit: Payer: 59 | Admitting: Family Medicine

## 2022-05-14 ENCOUNTER — Encounter: Payer: Self-pay | Admitting: Family Medicine

## 2022-05-14 DIAGNOSIS — G43111 Migraine with aura, intractable, with status migrainosus: Secondary | ICD-10-CM | POA: Diagnosis not present

## 2022-05-14 DIAGNOSIS — G43909 Migraine, unspecified, not intractable, without status migrainosus: Secondary | ICD-10-CM | POA: Insufficient documentation

## 2022-05-14 MED ORDER — PREDNISONE 10 MG PO TABS: ORAL_TABLET | ORAL | 0 refills | Status: DC

## 2022-05-14 NOTE — Assessment & Plan Note (Signed)
Headache on/off 3 weeks- may have worsened after hitting head 3 days into it  Seen in ER and at that time some numbness( better now) improved with migraine cocktail Reviewed hospital records, lab results and studies in detail  Has not tried fiorcet she was given  Has used excedrin migraine (? Poss rebound)  Nl exam  Reassuring CT and MRI  Suspect this was migraine then worsened by concussion and rebound Needs more fluids Disc limiting caffeine  Rev headache habits/handouts given  Px prednisone 40 mg taper to help break headache cycle  See AVS F/u if not improved

## 2022-05-14 NOTE — Patient Instructions (Addendum)
Get 64 oz fluids daily (mostly water)  Avoid too much caffeine   Stop the excedrin migraine for now    Rest your brain when you can   If you are not starting to improve in 7-10 days call and let us know

## 2022-05-14 NOTE — Progress Notes (Signed)
Subjective:    Patient ID: Krystal Brock, female    DOB: [birth year], 21 y.o.   MRN: [id number]  HPI Pt presents for c/o migraine headache   Wt Readings from Last 3 Encounters:  05/14/22 133 lb 2 oz (60.4 kg)  05/06/22 130 lb (59 kg)  01/08/22 135 lb 4 oz (61.3 kg)   21.49 kg/m  Here for 3 week headache on/off  She was seen in ER on 8/20   Presented with 2 d of headache with LUE numbness and bilat LE weakness (per pt she did not have head pain but note said she did)  Some photophobia and nausea  Improved with movement   Reassuring labs and EKG and CT head  Then also MRI of brain    Tx with migraine cocktail : toradol, reglan, benadryl and decadron along with fluids Neurology was consulted and cva ruled out   Was d/c with fiorcet px   Headache is still on and off Middle of forehead Throbs when she is dizzy (a little right now)  Dizziness is like vertigo   No uri symptoms and no fever   Headache 3 weeks  No trigger that she knows of  She did hit her head 3 days into the headache (slanted ceiling)   Did not have headache in the ER at all   Has never had migraines before    Right now headache is 6-7 10  Also feels very fatigued  Very hard to stay awake in class even with 8 hours of sleep per day   Does not know why   Not really stressed  School will get harder    No change in periods  Some cramping with them   Migraines run in the family  Mother has them  Aunt  Cousin   Drinks caffeine 3 times per week  Water intake - not enough- a bottle per day  Some vitamin water  Dilute juice    No change in vision  No lenses   Exedrin migraine does not help   Patient Active Problem List   Diagnosis Date Noted   Migraine 05/14/2022   Epistaxis 01/08/2022   Iron deficiency 03/14/2021   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 No past surgical history on file. Social History   Tobacco Use   Smoking status: Never   Smokeless tobacco: Never  Vaping Use   Vaping Use: Never used  Substance Use Topics   Alcohol use: Never   Drug use: Never   Family History  Problem Relation Age of Onset   Allergic rhinitis Father    Asthma Father    Thyroid disease Paternal Grandmother    Migraines Neg Hx    Celiac disease Neg Hx    Crohn's disease Neg Hx    Colitis Neg Hx    Diabetes Neg Hx    GER disease Neg Hx    Allergies  Allergen Reactions   Amoxicillin    Clindamycin/Lincomycin Rash    Delayed rash on trunk   Current Outpatient Medications on File Prior to Visit  Medication Sig Dispense Refill   APAP-Pamabrom-Pyrilamine (PAMPRIN MULTI-SYMPTOM MAX ST PO) Take by mouth. (Patient not taking: Reported on 05/06/2022)  No current facility-administered medications on file prior to visit.    Review of Systems  Constitutional:  Negative for activity change, appetite change, fatigue, fever and unexpected weight change.  HENT:  Negative for congestion, ear pain, rhinorrhea, sinus pressure, sinus pain and sore throat.   Eyes:  Negative for pain, redness and visual disturbance.  Respiratory:  Negative for cough, shortness of breath and wheezing.   Cardiovascular:  Negative for chest pain and palpitations.  Gastrointestinal:  Negative for abdominal pain, blood in stool, constipation and diarrhea.  Endocrine: Negative for polydipsia and polyuria.  Genitourinary:  Negative for dysuria, frequency and urgency.  Musculoskeletal:  Negative for arthralgias, back pain and myalgias.  Skin:  Negative for pallor and rash.  Allergic/Immunologic: Negative for environmental allergies.  Neurological:  Positive for dizziness and headaches. Negative  for tremors, seizures, syncope, facial asymmetry, speech difficulty, weakness and light-headedness.  Hematological:  Negative for adenopathy. Does not bruise/bleed easily.  Psychiatric/Behavioral:  Negative for decreased concentration and dysphoric mood. The patient is not nervous/anxious.        Objective:   Physical Exam Constitutional:      General: She is not in acute distress.    Appearance: Normal appearance. She is well-developed. She is not ill-appearing or diaphoretic.  HENT:     Head: Normocephalic and atraumatic.     Right Ear: Tympanic membrane, ear canal and external ear normal. There is no impacted cerumen.     Left Ear: Tympanic membrane, ear canal and external ear normal. There is no impacted cerumen.     Nose: Nose normal. No congestion.     Mouth/Throat:     Pharynx: No oropharyngeal exudate.  Eyes:     General: No scleral icterus.       Right eye: No discharge.        Left eye: No discharge.     Conjunctiva/sclera: Conjunctivae normal.     Pupils: Pupils are equal, round, and reactive to light.     Comments: No nystagmus Lateral gaze causes some dizziness  Neck:     Thyroid: No thyromegaly.     Vascular: No carotid bruit or JVD.     Trachea: No tracheal deviation.  Cardiovascular:     Rate and Rhythm: Normal rate and regular rhythm.     Heart sounds: Normal heart sounds. No murmur heard. Pulmonary:     Effort: Pulmonary effort is normal. No respiratory distress.     Breath sounds: Normal breath sounds. No wheezing or rales.  Abdominal:     General: Bowel sounds are normal. There is no distension.     Palpations: Abdomen is soft. There is no mass.     Tenderness: There is no abdominal tenderness.  Musculoskeletal:        General: No tenderness.     Cervical back: Full passive range of motion without pain, normal range of motion and neck supple.  Lymphadenopathy:     Cervical: No cervical adenopathy.  Skin:    General: Skin is warm and dry.      Coloration: Skin is not jaundiced or pale.     Findings: No bruising, erythema or rash.  Neurological:     Mental Status: She is alert and oriented to person, place, and time.     Cranial Nerves: Cranial nerves 2-12 are intact. No cranial nerve deficit, dysarthria or facial asymmetry.     Sensory: Sensation is intact. No sensory deficit.     Motor: Motor function is intact. No weakness,  tremor, atrophy, abnormal muscle tone or pronator drift.     Coordination: Romberg sign negative. Coordination normal. Finger-Nose-Finger Test normal.     Gait: Gait normal.     Deep Tendon Reflexes: Reflexes are normal and symmetric. Reflexes normal.     Comments: No focal cerebellar signs   Psychiatric:        Behavior: Behavior normal.        Thought Content: Thought content normal.           Assessment & Plan:   Problem List Items Addressed This Visit       Cardiovascular and Mediastinum   Migraine    Headache on/off 3 weeks- may have worsened after hitting head 3 days into it  Seen in ER and at that time some numbness( better now) improved with migraine cocktail Reviewed hospital records, lab results and studies in detail  Has not tried fiorcet she was given  Has used excedrin migraine (? Poss rebound)  Nl exam  Reassuring CT and MRI  Suspect this was migraine then worsened by concussion and rebound Needs more fluids Disc limiting caffeine  Rev headache habits/handouts given  Px prednisone 40 mg taper to help break headache cycle  See AVS F/u if not improved

## 2022-07-09 ENCOUNTER — Encounter: Payer: Self-pay | Admitting: Family Medicine

## 2022-07-09 ENCOUNTER — Ambulatory Visit: Payer: 59 | Admitting: Family Medicine

## 2022-07-09 VITALS — BP 104/62 | HR 58 | Temp 98.4°F | Ht 66.0 in | Wt 133.5 lb

## 2022-07-09 DIAGNOSIS — E611 Iron deficiency: Secondary | ICD-10-CM | POA: Diagnosis not present

## 2022-07-09 DIAGNOSIS — G43111 Migraine with aura, intractable, with status migrainosus: Secondary | ICD-10-CM

## 2022-07-09 DIAGNOSIS — N946 Dysmenorrhea, unspecified: Secondary | ICD-10-CM

## 2022-07-09 DIAGNOSIS — R5382 Chronic fatigue, unspecified: Secondary | ICD-10-CM | POA: Diagnosis not present

## 2022-07-09 DIAGNOSIS — R5383 Other fatigue: Secondary | ICD-10-CM | POA: Insufficient documentation

## 2022-07-09 LAB — CBC WITH DIFFERENTIAL/PLATELET
Basophils Absolute: 0 10*3/uL (ref 0.0–0.1)
Basophils Relative: 0.5 % (ref 0.0–3.0)
Eosinophils Absolute: 0.3 10*3/uL (ref 0.0–0.7)
Eosinophils Relative: 5.4 % — ABNORMAL HIGH (ref 0.0–5.0)
HCT: 34.5 % — ABNORMAL LOW (ref 36.0–46.0)
Hemoglobin: 11.2 g/dL — ABNORMAL LOW (ref 12.0–15.0)
Lymphocytes Relative: 23.5 % (ref 12.0–46.0)
Lymphs Abs: 1.3 10*3/uL (ref 0.7–4.0)
MCHC: 32.5 g/dL (ref 30.0–36.0)
MCV: 91.7 fl (ref 78.0–100.0)
Monocytes Absolute: 0.6 10*3/uL (ref 0.1–1.0)
Monocytes Relative: 11.4 % (ref 3.0–12.0)
Neutro Abs: 3.3 10*3/uL (ref 1.4–7.7)
Neutrophils Relative %: 59.2 % (ref 43.0–77.0)
Platelets: 171 10*3/uL (ref 150.0–400.0)
RBC: 3.76 Mil/uL — ABNORMAL LOW (ref 3.87–5.11)
RDW: 13 % (ref 11.5–14.6)
WBC: 5.6 10*3/uL (ref 4.5–10.5)

## 2022-07-09 LAB — FERRITIN: Ferritin: 27.3 ng/mL (ref 10.0–291.0)

## 2022-07-09 LAB — CORTISOL: Cortisol, Plasma: 9.1 ug/dL

## 2022-07-09 LAB — TSH: TSH: 2.64 u[IU]/mL (ref 0.35–5.50)

## 2022-07-09 LAB — IRON: Iron: 58 ug/dL (ref 42–145)

## 2022-07-09 MED ORDER — NORETHIN ACE-ETH ESTRAD-FE 1-20 MG-MCG(24) PO TABS: 1.0000 | ORAL_TABLET | Freq: Every day | ORAL | 11 refills | Status: AC

## 2022-07-09 NOTE — Assessment & Plan Note (Signed)
Hb 11.5 in aug  Some heavy menstrual days (px OC) Iron levels today  May recommend iron repl

## 2022-07-09 NOTE — Patient Instructions (Addendum)
Start the loestrin fe the first Sunday after your period starts  (if your period starts Sunday start the pill that day)  Take at the same time every day  Don't smoke If you become sexually active use condoms to prevent STDs  It takes about 3 months to adjust to the pill  If any side effects let us know   Labs for iron/cortisol and thyroid   I want to refer you to a neurologist for headaches  If you don't get a call in 1-2 months let us know   Take care of yourself  Drink fluids

## 2022-07-09 NOTE — Assessment & Plan Note (Signed)
Pt has c/o more headache with standing (almost always occur on transition sit to stand)  Spontaneous intracranial hypotension is in the differential though most recent imaging was normal  Reassuring exam  Enc good habits for ha prev Uses excerin migraine prn  Referral done to neurology

## 2022-07-09 NOTE — Progress Notes (Signed)
Subjective:    Patient ID: Krystal Brock, female    DOB: 2001-02-16, 21 y.o.   MRN: [id number]  HPI Pt presents for med management/ migraine   Wt Readings from Last 3 Encounters:  07/09/22 133 lb 8 oz (60.6 kg)  05/14/22 133 lb 2 oz (60.4 kg)  05/06/22 130 lb (59 kg)   21.55 kg/m    Last visit end of aug disc migraines We tx with prednisone 40 mg taper to break cycle  Enc more fluids  Rev last CT and MRI    She took the prednisone - it actually made her sleepy  Headache was done by that point   Headaches did come back  They occur when she stands up (better when sitting down)   During headache-light sensitive Both side  Some throbbing  Sometimes nausea  Last about an hour -usually goes to sleep   Gets 3-4 headaches per week right now  Any time  Worse when she stands up  Not as bad lying down (but that does not cure it)    Excedrin migraine helps Takes one pill  That helps in about 10 minutes   Occ ice coffee-not often  Does not have daily caffeine  3-4 bottles of water per day  Exercise is walking to class   Stress level is up due to the fatigue Tired all the time  Gets up to 14 hours of sleep per night at this point   Does not think she is depressed Eating is about that same Picky eater  Not obsessive over it lately    Wants to start OC for bad periods     Lab Results  Component Value Date   WBC 8.9 05/06/2022   HGB 11.5 (L) 05/06/2022   HCT 37.2 05/06/2022   MCV 94.7 05/06/2022   PLT 214 05/06/2022   Lab Results  Component Value Date   CREATININE 0.68 05/06/2022   BUN 9 05/06/2022   NA 137 05/06/2022   K 4.0 05/06/2022   CL 106 05/06/2022   CO2 21 (L) 05/06/2022   Glucose 107 non fasting  Lab Results  Component Value Date   ALT 15 05/06/2022   AST 23 05/06/2022   ALKPHOS 69 05/06/2022   BILITOT 0.7 05/06/2022   Lab Results  Component Value Date   TSH 1.47 12/06/2020   Patient Active Problem List   Diagnosis Date Noted    Painful menstrual periods 07/09/2022   Fatigue 07/09/2022   Migraine 05/14/2022   Epistaxis 01/08/2022   Iron deficiency 03/14/2021   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 No past surgical history on file. Social History   Tobacco Use   Smoking status: Never   Smokeless tobacco: Never  Vaping Use   Vaping Use: Never used  Substance Use Topics   Alcohol use: Never   Drug use: Never   Family History  Problem Relation Age of Onset   Allergic rhinitis Father    Asthma Father    Thyroid disease Paternal Grandmother    Migraines Neg Hx    Celiac disease Neg Hx    Crohn's disease Neg Hx    Colitis Neg Hx    Diabetes Neg Hx  GER disease Neg Hx    Allergies  Allergen Reactions   Amoxicillin    Clindamycin/Lincomycin Rash    Delayed rash on trunk   Current Outpatient Medications on File Prior to Visit  Medication Sig Dispense Refill   APAP-Pamabrom-Pyrilamine (PAMPRIN MULTI-SYMPTOM MAX ST PO) Take by mouth.     aspirin-acetaminophen-caffeine (EXCEDRIN MIGRAINE) 250-250-65 MG tablet Take by mouth every 6 (six) hours as needed for headache.     loratadine (CLARITIN) 10 MG tablet Take 10 mg by mouth daily as needed for allergies.     No current facility-administered medications on file prior to visit.      Review of Systems  Constitutional:  Positive for fatigue. Negative for activity change, appetite change, fever and unexpected weight change.  HENT:  Negative for congestion, ear pain, rhinorrhea, sinus pressure and sore throat.   Eyes:  Negative for pain, redness and visual disturbance.  Respiratory:  Negative for cough, shortness of breath and wheezing.    Cardiovascular:  Negative for chest pain and palpitations.  Gastrointestinal:  Negative for abdominal pain, blood in stool, constipation and diarrhea.  Endocrine: Negative for polydipsia and polyuria.  Genitourinary:  Negative for dysuria, frequency and urgency.  Musculoskeletal:  Negative for arthralgias, back pain and myalgias.  Skin:  Negative for pallor and rash.  Allergic/Immunologic: Negative for environmental allergies.  Neurological:  Positive for headaches. Negative for dizziness, tremors, seizures, syncope, facial asymmetry, speech difficulty, weakness, light-headedness and numbness.  Hematological:  Negative for adenopathy. Does not bruise/bleed easily.  Psychiatric/Behavioral:  Negative for decreased concentration and dysphoric mood. The patient is not nervous/anxious.        Fatigue is a stressor        Objective:   Physical Exam Constitutional:      General: She is not in acute distress.    Appearance: Normal appearance. She is well-developed and normal weight. She is not ill-appearing or diaphoretic.  HENT:     Head: Normocephalic and atraumatic.     Comments: No facial tenderness    Right Ear: Tympanic membrane, ear canal and external ear normal.     Left Ear: Tympanic membrane, ear canal and external ear normal.     Nose: Nose normal.     Mouth/Throat:     Pharynx: No oropharyngeal exudate.  Eyes:     General: No visual field deficit or scleral icterus.       Right eye: No discharge.        Left eye: No discharge.     Conjunctiva/sclera: Conjunctivae normal.     Pupils: Pupils are equal, round, and reactive to light.     Comments: No nystagmus  Neck:     Thyroid: No thyromegaly.     Vascular: No carotid bruit or JVD.     Trachea: No tracheal deviation.  Cardiovascular:     Rate and Rhythm: Normal rate and regular rhythm.     Heart sounds: Normal heart sounds. No murmur heard. Pulmonary:     Effort: Pulmonary effort is normal. No respiratory distress.      Breath sounds: Normal breath sounds. No wheezing or rales.  Abdominal:     General: Bowel sounds are normal. There is no distension.     Palpations: Abdomen is soft. There is no mass.     Tenderness: There is no abdominal tenderness.  Musculoskeletal:        General: No tenderness.     Cervical back: Full passive range of motion without pain, normal range  of motion and neck supple.  Lymphadenopathy:     Cervical: No cervical adenopathy.  Skin:    General: Skin is warm and dry.     Coloration: Skin is not pale.     Findings: No rash.  Neurological:     Mental Status: She is alert and oriented to person, place, and time.     Cranial Nerves: No cranial nerve deficit or facial asymmetry.     Sensory: Sensation is intact. No sensory deficit.     Motor: No weakness, tremor, atrophy or abnormal muscle tone.     Coordination: Coordination is intact. Romberg sign negative. Coordination normal. Finger-Nose-Finger Test normal.     Gait: Gait and tandem walk normal.     Deep Tendon Reflexes: Reflexes are normal and symmetric. Reflexes normal.     Comments: No focal cerebellar signs   Psychiatric:        Behavior: Behavior normal.        Thought Content: Thought content normal.     Comments: Blunted affect vs mildly timid today  Pleasant   Candidly discusses symptoms and stressors             Assessment & Plan:   Problem List Items Addressed This Visit       Cardiovascular and Mediastinum   Migraine - Primary    Pt has c/o more headache with standing (almost always occur on transition sit to stand)  Spontaneous intracranial hypotension is in the differential though most recent imaging was normal  Reassuring exam  Enc good habits for ha prev Uses excerin migraine prn  Referral done to neurology       Relevant Medications   aspirin-acetaminophen-caffeine (EXCEDRIN MIGRAINE) 250-250-65 MG tablet   Other Relevant Orders   Cortisol     Genitourinary   Painful menstrual  periods    Pt req start OC Px loestrin fe 1/20  Long discussion re: way to take OC properly and avoidance of smoking  Risks of blood clots outlined as well as possible side eff Pt aware that this does not prevent stds and condoms should still be used inst that it may take up to 3 months for menses to fall into rhythm or side eff to stop  Adv to call if problems or questions         Relevant Orders   Cortisol     Other   Fatigue    This may be multi factorial Unsure if related to her headaches  ? If disordered eating adds to this also She declines depression   TSH, cortisol and iron levels today (recent mild anemia likely from menses) Pend report       Relevant Orders   TSH   CBC with Differential/Platelet   Cortisol   Iron deficiency    Hb 11.5 in aug  Some heavy menstrual days (px OC) Iron levels today  May recommend iron repl      Relevant Orders   Iron   Ferritin   CBC with Differential/Platelet   Cortisol

## 2022-07-09 NOTE — Assessment & Plan Note (Signed)
This may be multi factorial Unsure if related to her headaches  ? If disordered eating adds to this also She declines depression   TSH, cortisol and iron levels today (recent mild anemia likely from menses) Pend report

## 2022-07-09 NOTE — Assessment & Plan Note (Signed)
Pt req start OC Px loestrin fe 1/20  Long discussion re: way to take OC properly and avoidance of smoking  Risks of blood clots outlined as well as possible side eff Pt aware that this does not prevent stds and condoms should still be used inst that it may take up to 3 months for menses to fall into rhythm or side eff to stop  Adv to call if problems or questions

## 2022-07-12 ENCOUNTER — Encounter: Payer: Self-pay | Admitting: *Deleted
